# Patient Record
Sex: Male | Born: 1995 | Race: Black or African American | Hispanic: No | Marital: Single | Smoking: Current some day smoker
Health system: Southern US, Community
[De-identification: ages and names within clinical notes are randomized; demographics above are authoritative.]

## PROBLEM LIST (undated history)

## (undated) ENCOUNTER — Ambulatory Visit (HOSPITAL_COMMUNITY): Admission: EM | Payer: 59 | Source: Home / Self Care

---
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
## 2015-09-28 ENCOUNTER — Emergency Department (HOSPITAL_COMMUNITY)
Admission: EM | Admit: 2015-09-28 | Discharge: 2015-09-28 | Disposition: A | Discharge status: Home / Self Care | Payer: 59 | Source: Home / Self Care | Attending: Family Medicine | Admitting: Family Medicine

## 2015-09-28 ENCOUNTER — Emergency Department (INDEPENDENT_AMBULATORY_CARE_PROVIDER_SITE_OTHER): Payer: 59

## 2015-09-28 ENCOUNTER — Encounter (HOSPITAL_COMMUNITY): Payer: Self-pay | Admitting: *Deleted

## 2015-09-28 DIAGNOSIS — S6000XA Contusion of unspecified finger without damage to nail, initial encounter: Secondary | ICD-10-CM

## 2015-09-28 NOTE — Discharge Instructions (Signed)
Ice and advil for swelling and soreness as needed. Ok to work as schduled.

## 2015-09-28 NOTE — ED Provider Notes (Signed)
CSN: [id number]     Arrival date & time 09/28/15  1516 History   First MD Initiated Contact with Patient 09/28/15 1642     Chief Complaint  Patient presents with  . Hand Injury   (Consider location/radiation/quality/duration/timing/severity/associated sxs/prior Treatment) Patient is a 19 y.o. male presenting with hand injury. The history is provided by the patient.  Hand Injury Location:  Hand Time since incident:  3 days Injury: yes   Mechanism of injury comment:  Boxes dropped on left hand with lmf mcp joint sts. works at [employer]. Hand location:  L hand Pain details:    Quality:  Sharp   Radiates to:  Does not radiate   Severity:  Mild   Onset quality:  Gradual Chronicity:  New Dislocation: no   Prior injury to area:  No   History reviewed. No pertinent past medical history. History reviewed. No pertinent past surgical history. History reviewed. No pertinent family history. Social History  Substance Use Topics  . Smoking status: Never Smoker   . Smokeless tobacco: None  . Alcohol Use: No    Review of Systems  Constitutional: Negative.   Musculoskeletal: Positive for joint swelling. Negative for gait problem.  Skin: Negative.  Negative for wound.  All other systems reviewed and are negative.   Allergies  Sulfa antibiotics  Home Medications   Prior to Admission medications   Not on File   Meds Ordered and Administered this Visit  Medications - No data to display  BP 149/92 mmHg  Pulse 86  Temp(Src) 98.8 F (37.1 C) (Oral)  Resp 16  SpO2 98% No data found.   Physical Exam  Constitutional: He is oriented to person, place, and time. He appears well-developed and well-nourished. No distress.  Musculoskeletal: Normal range of motion. He exhibits tenderness.       Hands: Neurological: He is alert and oriented to person, place, and time.  Skin: Skin is warm and dry.  Nursing note and vitals reviewed.   ED Course  Procedures (including critical care  time)  Labs Review Labs Reviewed - No data to display  Imaging Review Dg Hand Complete Left  09/28/2015  CLINICAL DATA:  Soft tissue swelling third metacarpophalangeal joint. No injury EXAM: LEFT HAND - COMPLETE 3+ VIEW COMPARISON:  None. FINDINGS: There is no evidence of fracture or dislocation. There is no evidence of arthropathy or other focal bone abnormality. Soft tissues are unremarkable. IMPRESSION: Negative. Electronically Signed   By: Marlan Palauharles  Clark M.D.   On: 09/28/2015 16:43   X-rays reviewed and report per radiologist.   Visual Acuity Review  Right Eye Distance:   Left Eye Distance:   Bilateral Distance:    Right Eye Near:   Left Eye Near:    Bilateral Near:         MDM   1. Contusion of finger of left hand, initial encounter        Linna HoffJames D Shekira Drummer, MD 09/28/15 1700

## 2015-09-28 NOTE — ED Notes (Signed)
Pt  Reports  He dropped  Some  Boxes on his  l hand    3  Days  Ago  At  Work     He    Has  Pain  And   Swelling    l    Hand

## 2016-05-01 ENCOUNTER — Encounter (HOSPITAL_COMMUNITY): Payer: Self-pay | Admitting: Emergency Medicine

## 2016-05-01 ENCOUNTER — Ambulatory Visit (HOSPITAL_COMMUNITY)
Admission: EM | Admit: 2016-05-01 | Discharge: 2016-05-01 | Disposition: A | Discharge status: Home / Self Care | Payer: 59 | Attending: Emergency Medicine | Admitting: Emergency Medicine

## 2016-05-01 DIAGNOSIS — K529 Noninfective gastroenteritis and colitis, unspecified: Secondary | ICD-10-CM | POA: Diagnosis not present

## 2016-05-01 MED ORDER — ONDANSETRON HCL 4 MG PO TABS: 4.0000 mg | ORAL_TABLET | Freq: Four times a day (QID) | ORAL | Status: DC

## 2016-05-01 NOTE — ED Notes (Signed)
PT reports abdominal pain for 3 days. PT reports pain is in epigastric area. PT reports pain was worst this AM, but has since eased off. PT reports he vomited twice in the last 24 hours. No diarrhea. PT rates pain 6/10

## 2016-05-01 NOTE — ED Provider Notes (Signed)
CSN: [id number]     Arrival date & time 05/01/16  1813 History   First MD Initiated Contact with Patient 05/01/16 1823     Chief Complaint  Patient presents with  . Abdominal Pain   (Consider location/radiation/quality/duration/timing/severity/associated sxs/prior Treatment) Patient is a 20 y.o. male presenting with abdominal pain. The history is provided by the patient.  Abdominal Pain Pain location:  R flank Pain quality: cramping   Pain radiates to:  Does not radiate Pain severity:  Mild Onset quality:  Sudden Duration:  3 days Timing:  Intermittent Progression:  Improving Chronicity:  New Context: eating   Relieved by:  Nothing Exacerbated by: smell of food. Ineffective treatments:  NSAIDs Associated symptoms: nausea and vomiting   Associated symptoms: no chills and no fever     History reviewed. No pertinent past medical history. History reviewed. No pertinent past surgical history. No family history on file. Social History  Substance Use Topics  . Smoking status: Never Smoker   . Smokeless tobacco: None  . Alcohol Use: No    Review of Systems  Constitutional: Negative for fever, chills and diaphoresis.  HENT: Negative for congestion.   Gastrointestinal: Positive for nausea, vomiting and abdominal pain.  Neurological: Negative for headaches.    Allergies  Sulfa antibiotics  Home Medications   Prior to Admission medications   Not on File   Meds Ordered and Administered this Visit  Medications - No data to display  BP 136/76 mmHg  Pulse 69  Temp(Src) 98.6 F (37 C) (Oral)  Resp 16  Ht 5\' 8"  (1.727 m)  Wt 136 lb (61.689 kg)  BMI 20.68 kg/m2  SpO2 98% No data found.   Physical Exam  Constitutional: He is oriented to person, place, and time. He appears well-developed and well-nourished.  HENT:  Head: Normocephalic.  Eyes: Pupils are equal, round, and reactive to light.  Cardiovascular: Normal rate, regular rhythm and normal heart sounds.    Pulmonary/Chest: No stridor. No respiratory distress. He has no wheezes. He has no rales. He exhibits no tenderness.  Abdominal: He exhibits no distension. There is tenderness (right flank).  Neurological: He is alert and oriented to person, place, and time.  Skin: Skin is warm and dry.    ED Course  Procedures (including critical care time)  Labs Review Labs Reviewed - No data to display  Imaging Review No results found.   Visual Acuity Review  Right Eye Distance:   Left Eye Distance:   Bilateral Distance:    Right Eye Near:   Left Eye Near:    Bilateral Near:         MDM   1. Gastroenteritis    Likely viral no signs of cholecystitis. Prescription given for zofran. Follow-up as needed.     Alene MiresJennifer C Caileb Rhue, NP 05/01/16 1850

## 2016-05-01 NOTE — Discharge Instructions (Signed)

## 2016-08-08 ENCOUNTER — Ambulatory Visit (HOSPITAL_COMMUNITY)
Admission: EM | Admit: 2016-08-08 | Discharge: 2016-08-08 | Disposition: A | Discharge status: Home / Self Care | Payer: 59 | Attending: Emergency Medicine | Admitting: Emergency Medicine

## 2016-08-08 ENCOUNTER — Encounter (HOSPITAL_COMMUNITY): Payer: Self-pay | Admitting: Emergency Medicine

## 2016-08-08 DIAGNOSIS — R0789 Other chest pain: Secondary | ICD-10-CM

## 2016-08-08 MED ORDER — IBUPROFEN 800 MG PO TABS: 800.0000 mg | ORAL_TABLET | Freq: Three times a day (TID) | ORAL | 0 refills | Status: AC

## 2016-08-08 NOTE — ED Provider Notes (Signed)
CSN: [id number]     Arrival date & time 08/08/16  1333 History   First MD Initiated Contact with Patient 08/08/16 1445     Chief Complaint  Patient presents with  . Chest Pain   (Consider location/radiation/quality/duration/timing/severity/associated sxs/prior Treatment) Braidon is a well-appearing 20 y.o male, without any medical history but has positive Fhx of HTN, presents today to Urgent Care for chest pain. Patient reports chest pain started this morning and has been intermittent since the onset. Patient was on his bed resting when he noticed the chest pain. Chest pain does not radiates. He denies abdominal pain, nausea, vomiting, fatigue, SOB, coughing, URI symptoms, anxiety. He does however endorses exercising regularly and weight lifting regularly such as dumbbell and bench press. 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      History reviewed. No pertinent past medical history. History reviewed. No pertinent surgical history. Family History  Problem Relation Age of Onset  . Hypertension Mother    Social History  Substance Use Topics  . Smoking status: Never Smoker  . Smokeless tobacco: Not on file  . Alcohol use No    Review of Systems  Constitutional: Negative for chills, fatigue and fever.  HENT: Negative for rhinorrhea, sneezing and sore throat.   Respiratory: Negative for cough, shortness of breath and wheezing.   Cardiovascular: Positive for chest pain. Negative for palpitations and leg swelling.  Gastrointestinal: Positive for vomiting. Negative for abdominal pain, diarrhea and nausea.  Musculoskeletal: Negative for arthralgias and myalgias.  Skin: Negative for rash.  Neurological: Negative for dizziness, syncope, weakness and headaches.    Allergies  Sulfa antibiotics  Home Medications   Prior to Admission medications   Medication Sig Start Date End Date Taking? Authorizing Provider  ibuprofen  (ADVIL,MOTRIN) 800 MG tablet Take 1 tablet (800 mg total) by mouth 3 (three) times daily. 08/08/16 08/13/16  Lucia Estelle, NP  ondansetron (ZOFRAN) 4 MG tablet Take 1 tablet (4 mg total) by mouth every 6 (six) hours. 05/01/16   Alene Mires, NP   Meds Ordered and Administered this Visit  Medications - No data to display  There were no vitals taken for this visit. No data found.   Physical Exam  Constitutional: He is oriented to person, place, and time. He appears well-developed and well-nourished.  HENT:  Head: Normocephalic and atraumatic.  Right Ear: External ear normal.  Left Ear: External ear normal.  Nose: Nose normal.  Mouth/Throat: Oropharynx is clear and moist. No oropharyngeal exudate.  Eyes: Conjunctivae and EOM are normal. Pupils are equal, round, and reactive to light.  Neck: Normal range of motion. Neck supple.  Cardiovascular: Normal rate, regular rhythm, normal heart sounds and intact distal pulses.  Exam reveals no gallop and no friction rub.   No murmur heard. Chest tender on palpation  Pulmonary/Chest: Effort normal and breath sounds normal. No respiratory distress. He has no wheezes.  Abdominal: Soft. Bowel sounds are normal.  Musculoskeletal: Normal range of motion.  Neurological: He is alert and oriented to person, place, and time. Coordination normal.  Skin: Skin is warm and dry.  Nursing note and vitals reviewed.   Urgent Care Course   Clinical Course    Procedures (including critical care time)  Labs Review Labs Reviewed - No data to display  Imaging Review No results found.   MDM   1. Chest pain, musculoskeletal    Chest pain consistent with MSK etiology. Physical  examination was unremarkable except for chest wall tenderness on palpation. EKG was normal sinus rhythm (EKG scanned to chart, see result review tab) I don't feel a chest xray is needed. Patient discharge home in good condition with prescription for ibuprofen. ER return precaution  discussed.    Lucia EstelleFeng Madeeha Costantino, NP 08/08/16 1514    Lucia EstelleFeng Buelah Rennie, NP 08/08/16 [phone]

## 2016-08-08 NOTE — ED Triage Notes (Signed)
Chest pain started after waking this morning.  Sharp, shooting pain .  Denies sob, denies nausea.  Patient reports pain is not made better or worse by anything.  Pain is consistent.

## 2016-08-08 NOTE — ED Notes (Signed)
ekg handed to West PawletZheng, NP

## 2017-04-13 ENCOUNTER — Emergency Department (HOSPITAL_COMMUNITY): Payer: 59

## 2017-04-13 ENCOUNTER — Emergency Department (HOSPITAL_COMMUNITY)
Admission: EM | Admit: 2017-04-13 | Discharge: 2017-04-13 | Disposition: A | Discharge status: Home / Self Care | Payer: 59 | Attending: Emergency Medicine | Admitting: Emergency Medicine

## 2017-04-13 ENCOUNTER — Encounter (HOSPITAL_COMMUNITY): Payer: Self-pay

## 2017-04-13 DIAGNOSIS — F17290 Nicotine dependence, other tobacco product, uncomplicated: Secondary | ICD-10-CM | POA: Diagnosis not present

## 2017-04-13 DIAGNOSIS — Y939 Activity, unspecified: Secondary | ICD-10-CM | POA: Insufficient documentation

## 2017-04-13 DIAGNOSIS — S92001A Unspecified fracture of right calcaneus, initial encounter for closed fracture: Secondary | ICD-10-CM

## 2017-04-13 DIAGNOSIS — S91302A Unspecified open wound, left foot, initial encounter: Secondary | ICD-10-CM | POA: Insufficient documentation

## 2017-04-13 DIAGNOSIS — Y929 Unspecified place or not applicable: Secondary | ICD-10-CM | POA: Diagnosis not present

## 2017-04-13 DIAGNOSIS — W3400XA Accidental discharge from unspecified firearms or gun, initial encounter: Secondary | ICD-10-CM

## 2017-04-13 DIAGNOSIS — Y998 Other external cause status: Secondary | ICD-10-CM | POA: Insufficient documentation

## 2017-04-13 LAB — COMPREHENSIVE METABOLIC PANEL
ALBUMIN: 3.9 g/dL (ref 3.5–5.0)
ALK PHOS: 72 U/L (ref 38–126)
ALT: 27 U/L (ref 17–63)
ANION GAP: 13 (ref 5–15)
AST: 36 U/L (ref 15–41)
BUN: 10 mg/dL (ref 6–20)
CHLORIDE: 103 mmol/L (ref 101–111)
CO2: 23 mmol/L (ref 22–32)
Calcium: 9.1 mg/dL (ref 8.9–10.3)
Creatinine, Ser: 1.02 mg/dL (ref 0.61–1.24)
GFR calc non Af Amer: >60 mL/min (ref >60)
GLUCOSE: 107 mg/dL — AB (ref 65–99)
POTASSIUM: 3.2 mmol/L — AB (ref 3.5–5.1)
SODIUM: 139 mmol/L (ref 135–145)
Total Bilirubin: 0.4 mg/dL (ref 0.3–1.2)
Total Protein: 8 g/dL (ref 6.5–8.1)

## 2017-04-13 LAB — CBC WITH DIFFERENTIAL/PLATELET
BASOS PCT: 1 %
Basophils Absolute: 0.1 10*3/uL (ref 0.0–0.1)
EOS ABS: 0.1 10*3/uL (ref 0.0–0.7)
EOS PCT: 1 %
HCT: 44.6 % (ref 39.0–52.0)
HEMOGLOBIN: 15.1 g/dL (ref 13.0–17.0)
LYMPHS ABS: 2.6 10*3/uL (ref 0.7–4.0)
Lymphocytes Relative: 35 %
MCH: 28.8 pg (ref 26.0–34.0)
MCHC: 33.9 g/dL (ref 30.0–36.0)
MCV: 85 fL (ref 78.0–100.0)
MONO ABS: 0.4 10*3/uL (ref 0.1–1.0)
Monocytes Relative: 6 %
NEUTROS PCT: 57 %
Neutro Abs: 4.2 10*3/uL (ref 1.7–7.7)
PLATELETS: 199 10*3/uL (ref 150–400)
RBC: 5.25 MIL/uL (ref 4.22–5.81)
RDW: 14 % (ref 11.5–15.5)
WBC: 7.4 10*3/uL (ref 4.0–10.5)

## 2017-04-13 MED ORDER — TETANUS-DIPHTH-ACELL PERTUSSIS 5-2.5-18.5 LF-MCG/0.5 IM SUSP
0.5000 mL | Freq: Once | INTRAMUSCULAR | Status: AC
  Administered 2017-04-13: 0.5 mL via INTRAMUSCULAR
  Filled 2017-04-13: qty 0.5

## 2017-04-13 MED ORDER — SODIUM CHLORIDE 0.9 % IV BOLUS (SEPSIS)
1000.0000 mL | Freq: Once | INTRAVENOUS | Status: AC
  Administered 2017-04-13: 1000 mL via INTRAVENOUS

## 2017-04-13 MED ORDER — CEFAZOLIN SODIUM-DEXTROSE 1-4 GM/50ML-% IV SOLN
1.0000 g | Freq: Once | INTRAVENOUS | Status: AC
  Administered 2017-04-13: 1 g via INTRAVENOUS
  Filled 2017-04-13: qty 50

## 2017-04-13 MED ORDER — CEPHALEXIN 500 MG PO CAPS: 500.0000 mg | ORAL_CAPSULE | Freq: Three times a day (TID) | ORAL | 0 refills | Status: AC

## 2017-04-13 MED ORDER — HYDROCODONE-ACETAMINOPHEN 5-325 MG PO TABS: 1.0000 | ORAL_TABLET | ORAL | 0 refills | Status: DC | PRN

## 2017-04-13 MED ORDER — MORPHINE SULFATE (PF) 4 MG/ML IV SOLN
4.0000 mg | Freq: Once | INTRAVENOUS | Status: AC
  Administered 2017-04-13: 4 mg via INTRAVENOUS
  Filled 2017-04-13: qty 1
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
## 2017-04-17 DIAGNOSIS — S91309A Unspecified open wound, unspecified foot, initial encounter: Secondary | ICD-10-CM | POA: Diagnosis not present

## 2017-04-19 DIAGNOSIS — M79672 Pain in left foot: Secondary | ICD-10-CM | POA: Diagnosis not present

## 2017-04-19 DIAGNOSIS — W3400XD Accidental discharge from unspecified firearms or gun, subsequent encounter: Secondary | ICD-10-CM | POA: Diagnosis not present

## 2017-04-30 ENCOUNTER — Ambulatory Visit: Payer: Self-pay | Admitting: Podiatry
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
## 2020-02-28 ENCOUNTER — Ambulatory Visit (INDEPENDENT_AMBULATORY_CARE_PROVIDER_SITE_OTHER): Payer: 59

## 2020-02-28 ENCOUNTER — Other Ambulatory Visit: Payer: Self-pay

## 2020-02-28 ENCOUNTER — Encounter (HOSPITAL_COMMUNITY): Payer: Self-pay

## 2020-02-28 ENCOUNTER — Ambulatory Visit (HOSPITAL_COMMUNITY)
Admission: EM | Admit: 2020-02-28 | Discharge: 2020-02-28 | Disposition: A | Discharge status: Home / Self Care | Payer: 59 | Attending: Family Medicine | Admitting: Family Medicine

## 2020-02-28 DIAGNOSIS — S61411A Laceration without foreign body of right hand, initial encounter: Secondary | ICD-10-CM

## 2020-02-28 DIAGNOSIS — S61401A Unspecified open wound of right hand, initial encounter: Secondary | ICD-10-CM | POA: Diagnosis not present

## 2020-02-28 MED ORDER — DOXYCYCLINE HYCLATE 100 MG PO CAPS: 100.0000 mg | ORAL_CAPSULE | Freq: Two times a day (BID) | ORAL | 0 refills | Status: AC

## 2020-02-28 NOTE — ED Provider Notes (Signed)
[place]    CSN: [id number] Arrival date & time: 02/28/20  1358      History   Chief Complaint Chief Complaint  Patient presents with  . Extremity Laceration    HPI Cory Waters is a 24 y.o. male a significant past medical history presenting today for evaluation of wound.  Patient notes that approximately 1 week ago he sustained wound to his right hand.  He was playing with his brother and accidentally cut it on glass.  He has not had it evaluated until today.  Prompted him to be seen was he is noticed an odor to his wound.  He reports he has been changing his dressing every 2 days.  He denies drainage.  He denies concern for fracture, reports that he has been moving his hands and fingers relatively well.  Has had some slight decrease in range of motion of fifth MCP of recently.  Tetanus last in 2018.  HPI  History reviewed. No pertinent past medical history.  There are no problems to display for this patient.   History reviewed. No pertinent surgical history.     Home Medications    Prior to Admission medications   Medication Sig Start Date End Date Taking? Authorizing Provider  doxycycline (VIBRAMYCIN) 100 MG capsule Take 1 capsule (100 mg total) by mouth 2 (two) times daily for 10 days. 02/28/20 03/09/20  Elgin Carn, Elesa Hacker, PA-C    Family History Family History  Problem Relation Age of Onset  . Hypertension Mother     Social History Social History   Tobacco Use  . Smoking status: Current Some Day Smoker    Types: Cigars  . Smokeless tobacco: Never Used  Substance Use Topics  . Alcohol use: Yes    Comment: occ  . Drug use: Yes    Types: Marijuana    Comment: occ     Allergies   Sulfa antibiotics   Review of Systems Review of Systems  Constitutional: Negative for fatigue and fever.  Eyes: Negative for redness, itching and visual disturbance.  Respiratory: Negative for shortness of breath.   Cardiovascular: Negative for chest pain  and leg swelling.  Gastrointestinal: Negative for nausea and vomiting.  Musculoskeletal: Positive for arthralgias. Negative for myalgias.  Skin: Positive for wound. Negative for color change and rash.  Neurological: Negative for dizziness, syncope, weakness, light-headedness and headaches.     Physical Exam Triage Vital Signs ED Triage Vitals  Enc Vitals Group     BP 02/28/20 1414 (!) 144/89     Pulse Rate 02/28/20 1414 (!) 103     Resp 02/28/20 1414 19     Temp 02/28/20 1414 97.9 F (36.6 C)     Temp Source 02/28/20 1414 Oral     SpO2 02/28/20 1414 100 %     Weight --      Height --      Head Circumference --      Peak Flow --      Pain Score 02/28/20 1412 0     Pain Loc --      Pain Edu? --      Excl. in [place]? --    No data found.  Updated Vital Signs BP (!) 144/89 (BP Location: Right Arm)   Pulse (!) 103   Temp 97.9 F (36.6 C) (Oral)   Resp 19   SpO2 100%   Visual Acuity Right Eye Distance:   Left Eye Distance:   Bilateral Distance:  Right Eye Near:   Left Eye Near:    Bilateral Near:     Physical Exam Vitals and nursing note reviewed.  Constitutional:      Appearance: He is well-developed.     Comments: No acute distress  HENT:     Head: Normocephalic and atraumatic.     Nose: Nose normal.  Eyes:     Conjunctiva/sclera: Conjunctivae normal.  Cardiovascular:     Rate and Rhythm: Normal rate.  Pulmonary:     Effort: Pulmonary effort is normal. No respiratory distress.  Abdominal:     General: There is no distension.  Musculoskeletal:        General: Normal range of motion.     Cervical back: Neck supple.     Comments: Slightly limited range of motion at fifth MCP  Radial pulse 2+  Skin:    General: Skin is warm and dry.     Comments: Dorsum of hand with laceration noted distally between fourth and fifth fingers, extends slightly into webbing, no tendon involvement, malodorous, wound edges appear white  Neurological:     Mental Status: He is  alert and oriented to person, place, and time.      UC Treatments / Results  Labs (all labs ordered are listed, but only abnormal results are displayed) Labs Reviewed - No data to display  EKG   Radiology DG Hand Complete Right  Result Date: 02/28/2020 CLINICAL DATA:  Wound infection after injury last week EXAM: RIGHT HAND - COMPLETE 3+ VIEW COMPARISON:  April 30, 2013. FINDINGS: Deformity of distal fifth metacarpal is noted consistent with old fracture. No acute fracture or dislocation is noted. Joint spaces are intact. No soft tissue abnormality is noted. IMPRESSION: Old fifth metacarpal fracture. No acute abnormality seen in the right hand. Electronically Signed   By: Lupita Raider M.D.   On: 02/28/2020 15:15    Procedures Procedures (including critical care time)  Medications Ordered in UC Medications - No data to display  Initial Impression / Assessment and Plan / UC Course  I have reviewed the triage vital signs and the nursing notes.  Pertinent labs & imaging results that were available during my care of the patient were reviewed by me and considered in my medical decision making (see chart for details).    Tetanus up-to-date Wound concerning for infection given smell and delayed wound healing, wound edges appear moist.  Does not appear to have any tendon involvement.  X-ray negative for signs of osteomyelitis.  Irrigated wound with 500 cc of sterile saline with syringe.  Dressed wound with nonadherent dressing and Coban and advised patient to take dressing off frequently and let air out while at home.  Initiating on doxycycline.  Frequent washings with drying really well afterward.  Advised if wound still not healing may need to follow-up with hand as may need debridement and secondary closure.  Discussed strict return precautions. Patient verbalized understanding and is agreeable with plan.  Final Clinical Impressions(s) / UC Diagnoses   Final diagnoses:  Laceration  of right hand without foreign body, initial encounter     Discharge Instructions     Please begin doxycycline twice daily for the next 10 days Keep wound clean and dry, allow frequent periods of airing out and washing wound well with warm soapy water, dry really well after getting wet Follow up with hand if still not healing   ED Prescriptions    Medication Sig Dispense Auth. Provider   doxycycline (VIBRAMYCIN)  100 MG capsule Take 1 capsule (100 mg total) by mouth 2 (two) times daily for 10 days. 20 capsule Kirbi Farrugia, [place] C, PA-C     PDMP not reviewed this encounter.   Lew Dawes, PA-C 02/28/20 1525

## 2020-02-28 NOTE — Discharge Instructions (Signed)
Please begin doxycycline twice daily for the next 10 days Keep wound clean and dry, allow frequent periods of airing out and washing wound well with warm soapy water, dry really well after getting wet Follow up with hand if still not healing
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
## 2020-08-28 DIAGNOSIS — Z20822 Contact with and (suspected) exposure to covid-19: Secondary | ICD-10-CM | POA: Diagnosis not present

## 2020-12-07 IMAGING — DX DG HAND COMPLETE 3+V*R*
3 series · 3 of 3 positions shown · non-contrast
Comparison: April 30, 2013.

CLINICAL DATA: Wound infection after injury last week

EXAM:
RIGHT HAND - COMPLETE 3+ VIEW

[hand pa]
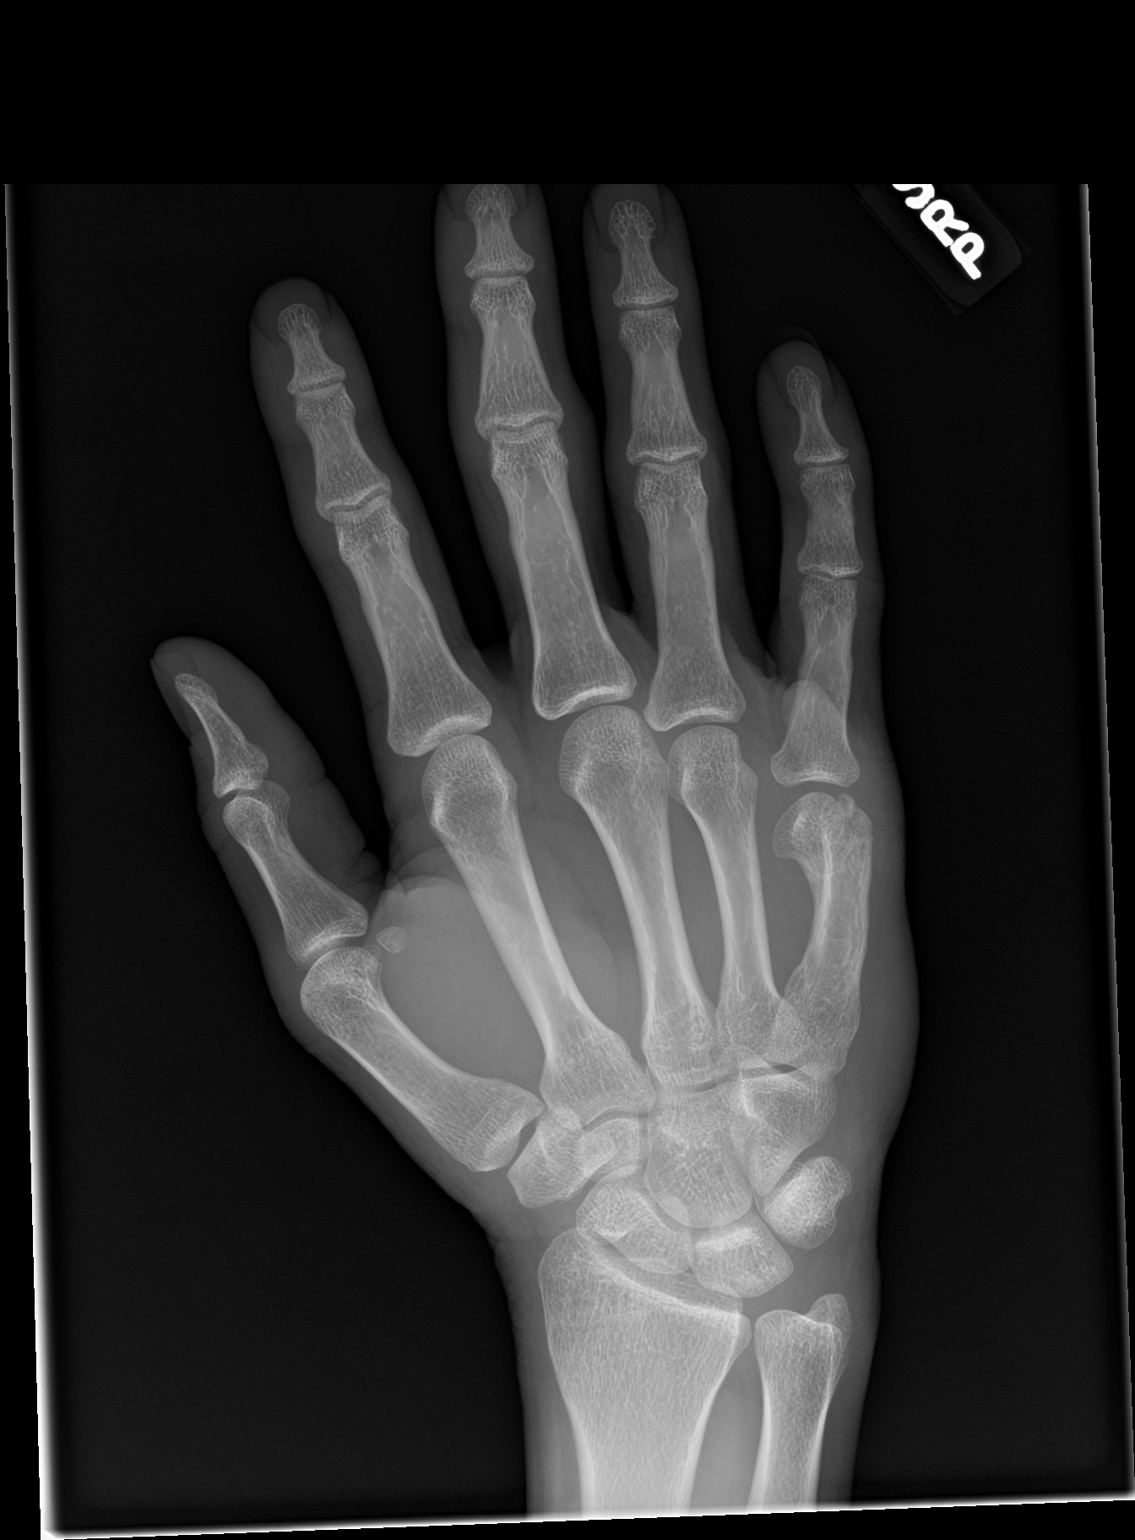

[hand obl]
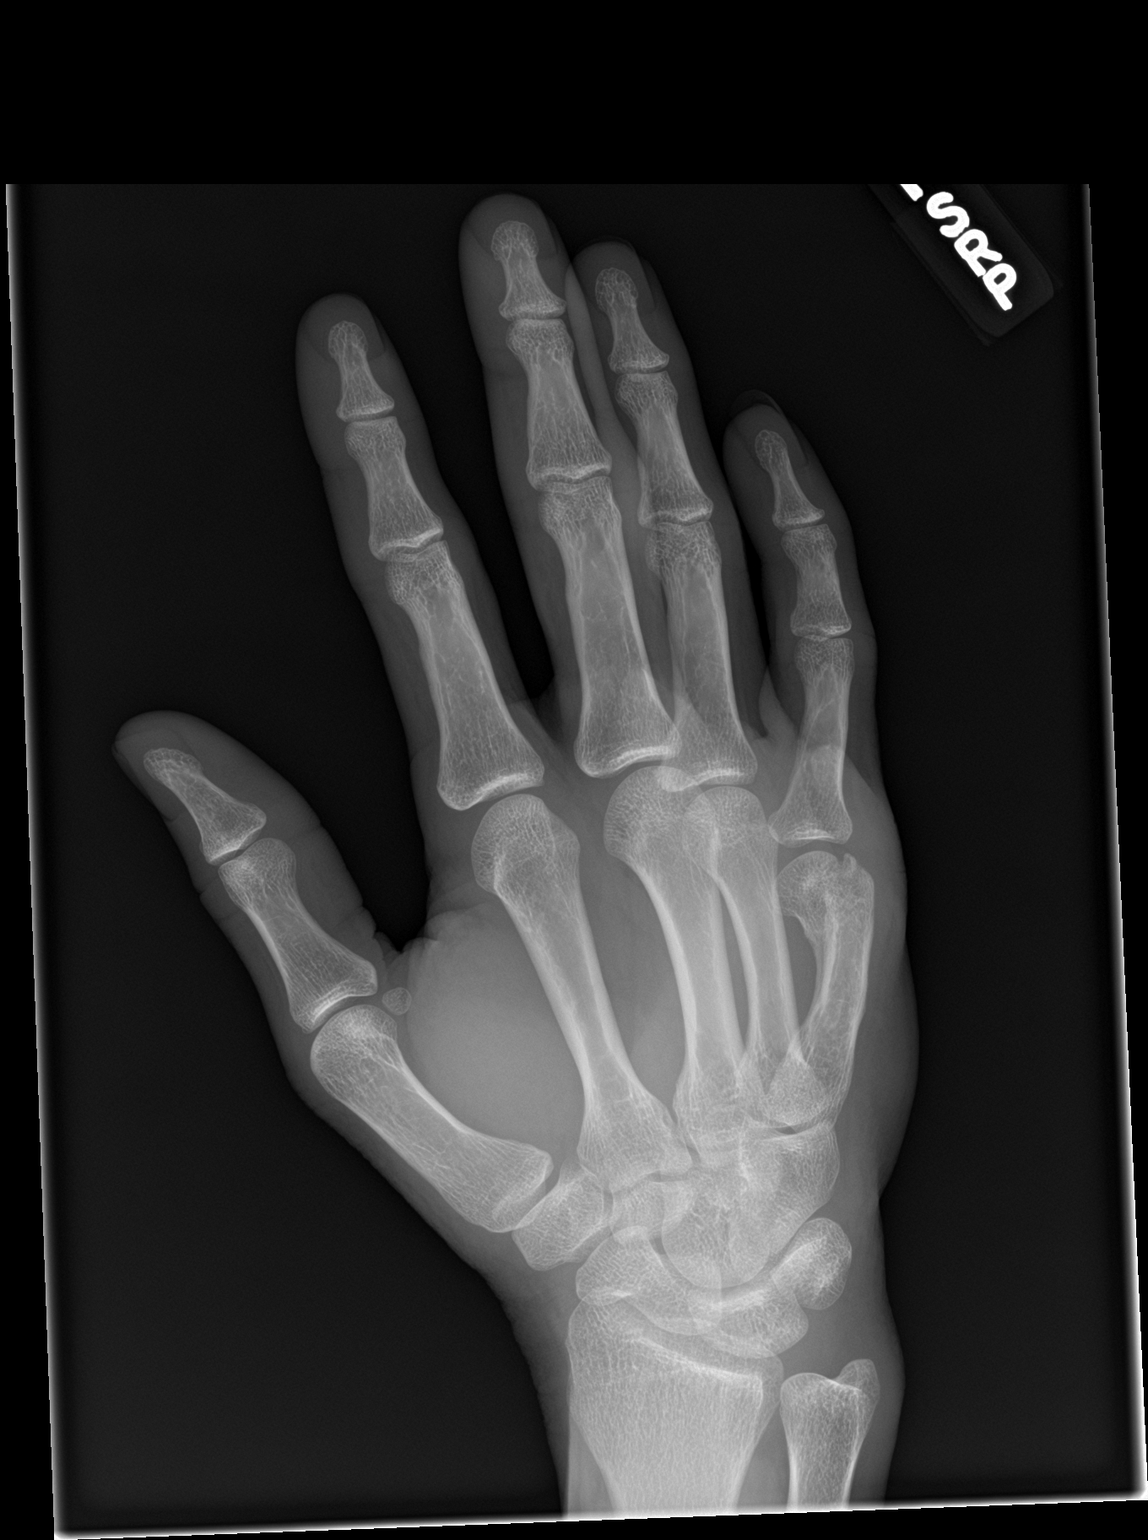

[hand lat]
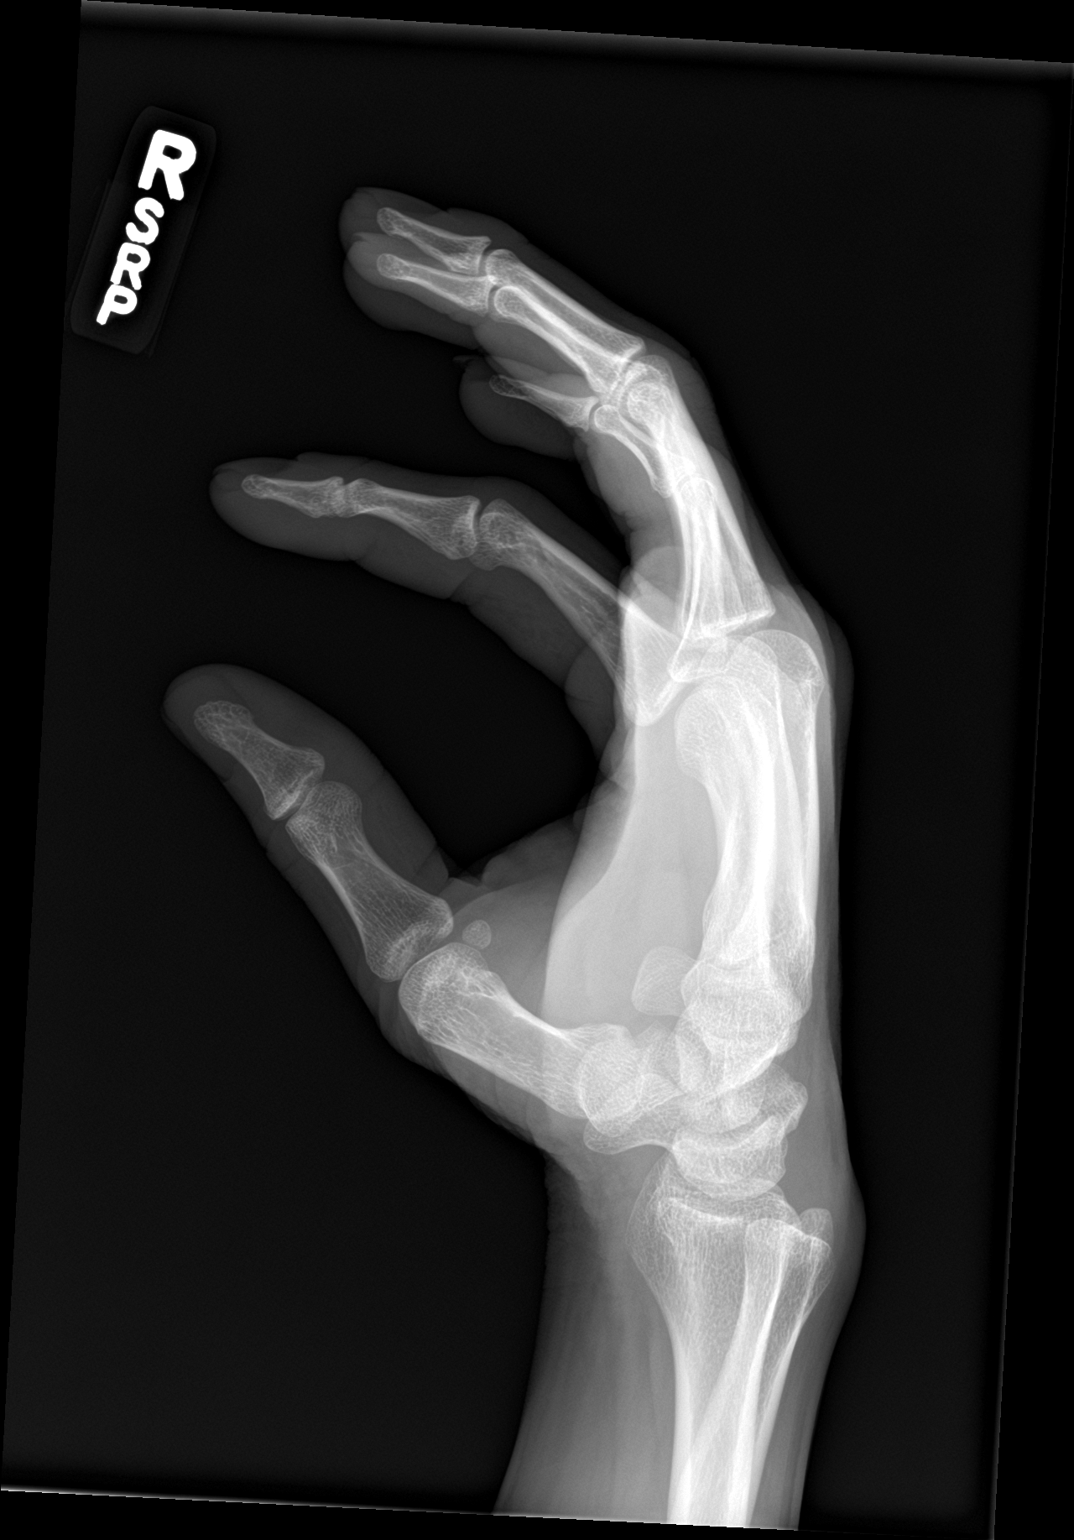

[3 of 3 positions shown; findings below may reference images not displayed]

FINDINGS: Deformity of distal fifth metacarpal is noted consistent with old
fracture. No acute fracture or dislocation is noted. Joint spaces
are intact. No soft tissue abnormality is noted.
IMPRESSION: Old fifth metacarpal fracture. No acute abnormality seen in the
right hand.

## 2024-07-01 ENCOUNTER — Emergency Department (HOSPITAL_BASED_OUTPATIENT_CLINIC_OR_DEPARTMENT_OTHER): Payer: Self-pay

## 2024-07-01 ENCOUNTER — Other Ambulatory Visit: Payer: Self-pay

## 2024-07-01 ENCOUNTER — Other Ambulatory Visit (HOSPITAL_BASED_OUTPATIENT_CLINIC_OR_DEPARTMENT_OTHER): Payer: Self-pay

## 2024-07-01 ENCOUNTER — Emergency Department (HOSPITAL_BASED_OUTPATIENT_CLINIC_OR_DEPARTMENT_OTHER): Payer: Self-pay | Admitting: Radiology

## 2024-07-01 ENCOUNTER — Emergency Department (HOSPITAL_BASED_OUTPATIENT_CLINIC_OR_DEPARTMENT_OTHER)
Admission: EM | Admit: 2024-07-01 | Discharge: 2024-07-01 | Disposition: A | Discharge status: Home / Self Care | Payer: Self-pay

## 2024-07-01 ENCOUNTER — Encounter (HOSPITAL_BASED_OUTPATIENT_CLINIC_OR_DEPARTMENT_OTHER): Payer: Self-pay | Admitting: Emergency Medicine

## 2024-07-01 DIAGNOSIS — S39012A Strain of muscle, fascia and tendon of lower back, initial encounter: Secondary | ICD-10-CM

## 2024-07-01 DIAGNOSIS — R519 Headache, unspecified: Secondary | ICD-10-CM | POA: Insufficient documentation

## 2024-07-01 DIAGNOSIS — M549 Dorsalgia, unspecified: Secondary | ICD-10-CM | POA: Insufficient documentation

## 2024-07-01 DIAGNOSIS — M542 Cervicalgia: Secondary | ICD-10-CM | POA: Diagnosis not present

## 2024-07-01 DIAGNOSIS — S161XXA Strain of muscle, fascia and tendon at neck level, initial encounter: Secondary | ICD-10-CM

## 2024-07-01 DIAGNOSIS — S0990XA Unspecified injury of head, initial encounter: Secondary | ICD-10-CM

## 2024-07-01 DIAGNOSIS — Y92410 Unspecified street and highway as the place of occurrence of the external cause: Secondary | ICD-10-CM | POA: Insufficient documentation

## 2024-07-01 MED ORDER — NAPROXEN 500 MG PO TABS: 500.0000 mg | ORAL_TABLET | Freq: Two times a day (BID) | ORAL | 0 refills | Status: DC

## 2024-07-01 MED ORDER — METHOCARBAMOL 750 MG PO TABS
750.0000 mg | ORAL_TABLET | Freq: Three times a day (TID) | ORAL | 0 refills | Status: AC
  Filled 2024-07-01: qty 21, 7d supply, fill #0

## 2024-07-01 MED ORDER — METHOCARBAMOL 500 MG PO TABS
750.0000 mg | ORAL_TABLET | Freq: Once | ORAL | Status: AC
  Administered 2024-07-01: 750 mg via ORAL
  Filled 2024-07-01: qty 2

## 2024-07-01 MED ORDER — NAPROXEN 500 MG PO TABS
500.0000 mg | ORAL_TABLET | Freq: Two times a day (BID) | ORAL | 0 refills | Status: AC
  Filled 2024-07-01: qty 20, 10d supply, fill #0

## 2024-07-01 MED ORDER — METHOCARBAMOL 750 MG PO TABS: 750.0000 mg | ORAL_TABLET | Freq: Three times a day (TID) | ORAL | 0 refills | Status: DC

## 2024-07-01 MED ORDER — NAPROXEN 250 MG PO TABS
500.0000 mg | ORAL_TABLET | Freq: Once | ORAL | Status: AC
  Administered 2024-07-01: 500 mg via ORAL
  Filled 2024-07-01: qty 2

## 2024-07-01 NOTE — Discharge Instructions (Signed)
 Please follow-up closely with a primary care doctor on an outpatient basis.  Return to emergency department immediately for any new or worsening symptoms.
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
